# Patient Record
Sex: Female | Born: 1950
Health system: Southern US, Community
[De-identification: ages and names within clinical notes are randomized; demographics above are authoritative.]

## PROBLEM LIST (undated history)

## (undated) DIAGNOSIS — D229 Melanocytic nevi, unspecified: Secondary | ICD-10-CM

---
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
## 1999-10-17 ENCOUNTER — Encounter: Admission: RE | Admit: 1999-10-17 | Discharge: 1999-10-17 | Payer: Self-pay | Admitting: Obstetrics and Gynecology

## 1999-10-17 ENCOUNTER — Encounter: Payer: Self-pay | Admitting: Obstetrics and Gynecology

## 2000-11-11 ENCOUNTER — Other Ambulatory Visit: Admission: RE | Admit: 2000-11-11 | Discharge: 2000-11-11 | Payer: Self-pay | Admitting: Obstetrics and Gynecology

## 2001-03-20 ENCOUNTER — Encounter: Payer: Self-pay | Admitting: Obstetrics and Gynecology

## 2001-03-20 ENCOUNTER — Encounter: Admission: RE | Admit: 2001-03-20 | Discharge: 2001-03-20 | Payer: Self-pay | Admitting: Obstetrics and Gynecology

## 2001-11-03 ENCOUNTER — Other Ambulatory Visit: Admission: RE | Admit: 2001-11-03 | Discharge: 2001-11-03 | Payer: Self-pay | Admitting: Gynecology

## 2002-03-30 ENCOUNTER — Encounter: Payer: Self-pay | Admitting: Gynecology

## 2002-03-30 ENCOUNTER — Encounter: Admission: RE | Admit: 2002-03-30 | Discharge: 2002-03-30 | Payer: Self-pay | Admitting: Gynecology

## 2002-11-08 ENCOUNTER — Other Ambulatory Visit: Admission: RE | Admit: 2002-11-08 | Discharge: 2002-11-08 | Payer: Self-pay | Admitting: Gynecology

## 2003-04-07 ENCOUNTER — Encounter: Admission: RE | Admit: 2003-04-07 | Discharge: 2003-04-07 | Payer: Self-pay | Admitting: Gynecology

## 2003-04-07 ENCOUNTER — Encounter: Payer: Self-pay | Admitting: Gynecology

## 2004-03-19 ENCOUNTER — Other Ambulatory Visit: Admission: RE | Admit: 2004-03-19 | Discharge: 2004-03-19 | Payer: Self-pay | Admitting: Gynecology

## 2004-05-07 ENCOUNTER — Other Ambulatory Visit: Admission: RE | Admit: 2004-05-07 | Discharge: 2004-05-07 | Payer: Self-pay | Admitting: Gynecology

## 2004-05-24 ENCOUNTER — Encounter: Admission: RE | Admit: 2004-05-24 | Discharge: 2004-05-24 | Payer: Self-pay | Admitting: Gynecology

## 2005-04-19 DIAGNOSIS — D229 Melanocytic nevi, unspecified: Secondary | ICD-10-CM

## 2005-04-19 HISTORY — DX: Melanocytic nevi, unspecified: D22.9

## 2005-05-28 ENCOUNTER — Encounter: Admission: RE | Admit: 2005-05-28 | Discharge: 2005-05-28 | Payer: Self-pay | Admitting: Gynecology

## 2005-05-28 ENCOUNTER — Other Ambulatory Visit: Admission: RE | Admit: 2005-05-28 | Discharge: 2005-05-28 | Payer: Self-pay | Admitting: Gynecology

## 2005-08-27 DIAGNOSIS — D229 Melanocytic nevi, unspecified: Secondary | ICD-10-CM

## 2005-08-27 HISTORY — DX: Melanocytic nevi, unspecified: D22.9

## 2006-06-23 ENCOUNTER — Other Ambulatory Visit: Admission: RE | Admit: 2006-06-23 | Discharge: 2006-06-23 | Payer: Self-pay | Admitting: Gynecology

## 2006-07-16 ENCOUNTER — Encounter: Admission: RE | Admit: 2006-07-16 | Discharge: 2006-07-16 | Payer: Self-pay | Admitting: Gynecology

## 2007-07-21 ENCOUNTER — Ambulatory Visit (HOSPITAL_BASED_OUTPATIENT_CLINIC_OR_DEPARTMENT_OTHER): Admission: RE | Admit: 2007-07-21 | Discharge: 2007-07-21 | Payer: Self-pay | Admitting: Orthopedic Surgery

## 2007-11-13 ENCOUNTER — Encounter: Admission: RE | Admit: 2007-11-13 | Discharge: 2007-11-13 | Payer: Self-pay | Admitting: Gynecology

## 2008-12-01 ENCOUNTER — Encounter: Admission: RE | Admit: 2008-12-01 | Discharge: 2008-12-01 | Payer: Self-pay | Admitting: Gynecology

## 2008-12-07 ENCOUNTER — Encounter: Admission: RE | Admit: 2008-12-07 | Discharge: 2008-12-07 | Payer: Self-pay | Admitting: Gynecology

## 2009-12-26 ENCOUNTER — Encounter: Admission: RE | Admit: 2009-12-26 | Discharge: 2009-12-26 | Payer: Self-pay | Admitting: Gynecology

## 2010-07-29 ENCOUNTER — Encounter: Payer: Self-pay | Admitting: Gynecology

## 2010-07-30 ENCOUNTER — Encounter: Payer: Self-pay | Admitting: Gynecology

## 2010-11-20 NOTE — Op Note (Signed)
Ruth Rios, Ruth Rios             ACCOUNT NO.:  [id number]   MEDICAL RECORD NO.:  [id number]          PATIENT TYPE:  AMB   LOCATION:  DSC                          FACILITY:  [facility]   PHYSICIAN:  Katy Fitch. Sypher, M.D. 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 Resection of mucoid cyst from the dorsal nail fold.  2. Arthrotomy of interphalangeal joint left thumb with synovectomy and      removal of loose bodies and marginal osteophytes.   OPERATIONS:  Ruth Igo, MD.   ASSISTANT:  Annye Rusk, PA-C.   ANESTHESIA:  2% lidocaine metacarpal head level block left thumb  supplemented by IV sedation, supervising anesthesiologist is Dr.  Sampson Goon.   INDICATIONS:  Ruth Rios is a 60 year old woman whose referred  through the courtesy of Dr. Benjaman Kindler for evaluation and management of  a large mucoid cyst on the dorsal nail fold of the left thumb.  She had  background osteoarthritis that she was tolerating rather well.  The cyst  became enlarged to the point of near rupture measuring 1.5 cm in  diameter.   In the office, we provided a detailed informed consent and reviewed x-  rays which revealed degenerative arthritis of the interphalangeal joint.  We advised Ruth Rios to undergo debridement of the mucoid cyst and  synovectomy/debridement of the interphalangeal joint to remove loose  bodies and marginal osteophytes.   She understands that we cannot control the arthritis process with this  procedure.  Our goal is to relieve her of the mucoid cyst to prevent  rupture and possible septic arthritis.   Questions were invited and answered in detail in the office as well as  the preop holding area.   PROCEDURE:  Yassmine Tamm is brought to the operating room and placed  in supine position on the operating table.   Following light sedation, the left arm was prepped with Betadine soap  solution and sterilely draped.  2% lidocaine was infiltrated at the  metacarpal head level to obtain a digital block of the left thumb.  Following exsanguination of the thumb with a gauze wrap, a 1/2-inch  Penrose drain was placed at the base the proximal phalanx as a digital  tourniquet.   A dorsal lazy S incision was fashioned to expose the extensor mechanism  and the cyst.  The cyst was circumferentially dissected and removed  completely with its contents from the dorsal periosteum of the base of  the distal phalanx.  There  appeared to be a sinus tract exiting along  the dorsal ulnar aspect of the joint.   Dorsal radial and dorsal ulnar arthrotomies were performed by removing  the capsule between the radial collateral ligament and the extensor  tendon and the ulnar collateral ligament and the extensor tendon.  A  micro rongeur and micro curette were used to perform complete  synovectomy of the dorsal aspect of the IP joint as well as to remove  marginal osteophytes at the dorsal radial base of the distal phalanx and  to debride the osteophytes at the insertion of the extensor tendon at  the base of distal phalanx.  A micro curette was used to work beneath  the collateral ligaments.   After complete synovectomy, the wound was irrigated thoroughly with  sterile saline followed by intra-articular irrigation with a  19-gauge  blunt dental needle.   Bleeding points were electrocauterized with bipolar current followed by  release of the tourniquet.  Hemostasis was achieved by direct  compression followed by repair of the wound with corner sutures of 5-0  nylon and horizontal mattress sutures of 5-0 nylon.   The wound was dressed with Xeroflo, sterile gauze and Coban.  There were  no apparent complications.   For aftercare Ms. Koopmann is provided a prescription for Vicodin 5 mg 1  p.o. q.4-6 hours p.r.n. pain 20 tablets without refill.  Also Keflex 500  mg one p.o. q.8 h x4 days as a prophylactic antibiotic.      Katy Fitch Sypher, M.D.  Electronically Signed     RVS/MEDQ  D:  07/21/2007  T:  07/21/2007  Job:  [id number]   cc:   Theressa Millard, M.D.

## 2010-12-06 ENCOUNTER — Ambulatory Visit: Payer: Self-pay | Admitting: Psychology

## 2010-12-14 ENCOUNTER — Ambulatory Visit (INDEPENDENT_AMBULATORY_CARE_PROVIDER_SITE_OTHER): Payer: BC Managed Care – PPO | Admitting: Psychology

## 2010-12-14 DIAGNOSIS — F4323 Adjustment disorder with mixed anxiety and depressed mood: Secondary | ICD-10-CM

## 2010-12-28 ENCOUNTER — Ambulatory Visit (INDEPENDENT_AMBULATORY_CARE_PROVIDER_SITE_OTHER): Payer: BC Managed Care – PPO | Admitting: Psychology

## 2010-12-28 DIAGNOSIS — F4323 Adjustment disorder with mixed anxiety and depressed mood: Secondary | ICD-10-CM

## 2011-01-14 ENCOUNTER — Other Ambulatory Visit: Payer: Self-pay | Admitting: Gynecology

## 2011-01-14 ENCOUNTER — Other Ambulatory Visit: Payer: Self-pay | Admitting: Internal Medicine

## 2011-01-14 DIAGNOSIS — Z1231 Encounter for screening mammogram for malignant neoplasm of breast: Secondary | ICD-10-CM

## 2011-02-07 ENCOUNTER — Ambulatory Visit
Admission: RE | Admit: 2011-02-07 | Discharge: 2011-02-07 | Disposition: A | Discharge status: Home / Self Care | Payer: BC Managed Care – PPO | Source: Ambulatory Visit | Attending: Gynecology | Admitting: Gynecology

## 2011-02-07 DIAGNOSIS — Z1231 Encounter for screening mammogram for malignant neoplasm of breast: Secondary | ICD-10-CM

## 2011-03-28 LAB — BASIC METABOLIC PANEL
BUN: 3 — ABNORMAL LOW
CO2: 30
Calcium: 9.3
Chloride: 90 — ABNORMAL LOW
Creatinine, Ser: 0.65
GFR calc Af Amer: >60
GFR calc non Af Amer: >60
Glucose, Bld: 98
Potassium: 3.6
Sodium: 131 — ABNORMAL LOW

## 2011-08-26 ENCOUNTER — Ambulatory Visit (HOSPITAL_COMMUNITY)
Admission: RE | Admit: 2011-08-26 | Discharge: 2011-08-26 | Disposition: A | Discharge status: Home / Self Care | Payer: BC Managed Care – PPO | Source: Ambulatory Visit | Attending: Orthopaedic Surgery | Admitting: Orthopaedic Surgery

## 2011-08-26 DIAGNOSIS — M79609 Pain in unspecified limb: Secondary | ICD-10-CM | POA: Insufficient documentation

## 2011-08-26 DIAGNOSIS — R52 Pain, unspecified: Secondary | ICD-10-CM

## 2011-08-26 NOTE — Progress Notes (Signed)
VASCULAR LAB PRELIMINARY  PRELIMINARY  PRELIMINARY  PRELIMINARY  Right lower extremity venous duplex completed.    Preliminary report:  Superficial thrombosis noted in the right lesser saphenous vein.  No propagation into the popliteal vein.  All other veins patent.  Fluid noted in the popliteal fossa and in the upper calf where the patient describes the most pain.     Terance Hart, RVT 08/26/2011, 5:00 PM

## 2011-09-04 ENCOUNTER — Ambulatory Visit (HOSPITAL_COMMUNITY): Admission: RE | Admit: 2011-09-04 | Payer: BC Managed Care – PPO | Source: Ambulatory Visit

## 2011-09-05 ENCOUNTER — Ambulatory Visit (HOSPITAL_COMMUNITY)
Admission: RE | Admit: 2011-09-05 | Discharge: 2011-09-05 | Disposition: A | Discharge status: Home / Self Care | Payer: BC Managed Care – PPO | Source: Ambulatory Visit | Attending: Orthopaedic Surgery | Admitting: Orthopaedic Surgery

## 2011-09-05 DIAGNOSIS — Z86718 Personal history of other venous thrombosis and embolism: Secondary | ICD-10-CM | POA: Insufficient documentation

## 2011-09-05 DIAGNOSIS — I82409 Acute embolism and thrombosis of unspecified deep veins of unspecified lower extremity: Secondary | ICD-10-CM

## 2011-09-05 NOTE — Progress Notes (Signed)
VASCULAR LAB PRELIMINARY  PRELIMINARY  PRELIMINARY  PRELIMINARY  Limited right lower extremity venous duplex has been completed.    Preliminary report: The right short saphenous vein thrombosis noted on 08/26/2011 has not propagated and appears small than previously noted.  Vanna Scotland, 09/05/2011, 6:52 PM

## 2011-09-26 ENCOUNTER — Ambulatory Visit (HOSPITAL_COMMUNITY): Payer: BC Managed Care – PPO

## 2011-09-27 ENCOUNTER — Ambulatory Visit (HOSPITAL_COMMUNITY)
Admission: RE | Admit: 2011-09-27 | Discharge: 2011-09-27 | Disposition: A | Discharge status: Home / Self Care | Payer: BC Managed Care – PPO | Source: Ambulatory Visit | Attending: Family Medicine | Admitting: Family Medicine

## 2011-09-27 DIAGNOSIS — M79609 Pain in unspecified limb: Secondary | ICD-10-CM

## 2011-09-27 DIAGNOSIS — M7989 Other specified soft tissue disorders: Secondary | ICD-10-CM | POA: Insufficient documentation

## 2011-09-27 DIAGNOSIS — R52 Pain, unspecified: Secondary | ICD-10-CM

## 2011-09-27 NOTE — Progress Notes (Signed)
VASCULAR LAB PRELIMINARY  PRELIMINARY  PRELIMINARY  PRELIMINARY  Right lower extremity venous duplex completed.    Preliminary report:   Right lower extremity negative for DVT and superficial thrombosis. Unable to identify right lesser saphenous vein possibly due to atrophy. Prior lesser saphenous thrombosis appears to have completely resolved. Right baker's cyst seen.  Elpidio Galea RDMS, RDCS Vanna Scotland, 09/27/2011, 11:22 AM

## 2013-07-21 ENCOUNTER — Other Ambulatory Visit: Payer: Self-pay

## 2013-07-21 DIAGNOSIS — Z1231 Encounter for screening mammogram for malignant neoplasm of breast: Secondary | ICD-10-CM

## 2013-08-12 ENCOUNTER — Ambulatory Visit
Admission: RE | Admit: 2013-08-12 | Discharge: 2013-08-12 | Disposition: A | Discharge status: Home / Self Care | Payer: BC Managed Care – PPO | Source: Ambulatory Visit

## 2013-08-12 DIAGNOSIS — Z1231 Encounter for screening mammogram for malignant neoplasm of breast: Secondary | ICD-10-CM

## 2014-09-20 ENCOUNTER — Other Ambulatory Visit: Payer: Self-pay

## 2014-09-20 DIAGNOSIS — Z1231 Encounter for screening mammogram for malignant neoplasm of breast: Secondary | ICD-10-CM

## 2014-10-13 ENCOUNTER — Ambulatory Visit
Admission: RE | Admit: 2014-10-13 | Discharge: 2014-10-13 | Disposition: A | Discharge status: Home / Self Care | Payer: BLUE CROSS/BLUE SHIELD | Source: Ambulatory Visit

## 2014-10-13 DIAGNOSIS — Z1231 Encounter for screening mammogram for malignant neoplasm of breast: Secondary | ICD-10-CM

## 2015-12-11 ENCOUNTER — Other Ambulatory Visit: Payer: Self-pay | Admitting: Obstetrics and Gynecology

## 2015-12-11 ENCOUNTER — Other Ambulatory Visit: Payer: Self-pay

## 2015-12-11 DIAGNOSIS — Z1231 Encounter for screening mammogram for malignant neoplasm of breast: Secondary | ICD-10-CM

## 2015-12-25 ENCOUNTER — Ambulatory Visit: Payer: BLUE CROSS/BLUE SHIELD

## 2016-03-13 ENCOUNTER — Ambulatory Visit: Payer: BLUE CROSS/BLUE SHIELD

## 2016-03-15 ENCOUNTER — Ambulatory Visit: Payer: BLUE CROSS/BLUE SHIELD

## 2016-03-22 ENCOUNTER — Ambulatory Visit
Admission: RE | Admit: 2016-03-22 | Discharge: 2016-03-22 | Disposition: A | Discharge status: Home / Self Care | Payer: BLUE CROSS/BLUE SHIELD | Source: Ambulatory Visit | Attending: Obstetrics and Gynecology | Admitting: Obstetrics and Gynecology

## 2016-03-22 DIAGNOSIS — Z1231 Encounter for screening mammogram for malignant neoplasm of breast: Secondary | ICD-10-CM

## 2017-03-07 ENCOUNTER — Other Ambulatory Visit: Payer: Self-pay | Admitting: Obstetrics and Gynecology

## 2017-03-07 DIAGNOSIS — Z1231 Encounter for screening mammogram for malignant neoplasm of breast: Secondary | ICD-10-CM

## 2017-03-24 ENCOUNTER — Ambulatory Visit: Payer: BLUE CROSS/BLUE SHIELD

## 2017-03-27 ENCOUNTER — Ambulatory Visit
Admission: RE | Admit: 2017-03-27 | Discharge: 2017-03-27 | Disposition: A | Discharge status: Home / Self Care | Payer: BLUE CROSS/BLUE SHIELD | Source: Ambulatory Visit | Attending: Obstetrics and Gynecology | Admitting: Obstetrics and Gynecology

## 2017-03-27 DIAGNOSIS — Z1231 Encounter for screening mammogram for malignant neoplasm of breast: Secondary | ICD-10-CM

## 2017-08-07 DIAGNOSIS — R69 Illness, unspecified: Secondary | ICD-10-CM | POA: Diagnosis not present

## 2017-08-12 DIAGNOSIS — Z1211 Encounter for screening for malignant neoplasm of colon: Secondary | ICD-10-CM | POA: Diagnosis not present

## 2017-08-12 DIAGNOSIS — Z0000 Encounter for general adult medical examination without abnormal findings: Secondary | ICD-10-CM | POA: Diagnosis not present

## 2017-08-12 DIAGNOSIS — N952 Postmenopausal atrophic vaginitis: Secondary | ICD-10-CM | POA: Diagnosis not present

## 2017-08-12 DIAGNOSIS — Z136 Encounter for screening for cardiovascular disorders: Secondary | ICD-10-CM | POA: Diagnosis not present

## 2017-08-12 DIAGNOSIS — R609 Edema, unspecified: Secondary | ICD-10-CM | POA: Diagnosis not present

## 2017-08-12 DIAGNOSIS — M79644 Pain in right finger(s): Secondary | ICD-10-CM | POA: Diagnosis not present

## 2017-08-12 DIAGNOSIS — Z86718 Personal history of other venous thrombosis and embolism: Secondary | ICD-10-CM | POA: Diagnosis not present

## 2017-08-22 DIAGNOSIS — R229 Localized swelling, mass and lump, unspecified: Secondary | ICD-10-CM | POA: Diagnosis not present

## 2017-08-22 DIAGNOSIS — M19031 Primary osteoarthritis, right wrist: Secondary | ICD-10-CM | POA: Diagnosis not present

## 2017-09-10 DIAGNOSIS — R2231 Localized swelling, mass and lump, right upper limb: Secondary | ICD-10-CM | POA: Diagnosis not present

## 2017-09-15 DIAGNOSIS — D229 Melanocytic nevi, unspecified: Secondary | ICD-10-CM | POA: Diagnosis not present

## 2017-09-15 DIAGNOSIS — L669 Cicatricial alopecia, unspecified: Secondary | ICD-10-CM | POA: Diagnosis not present

## 2017-09-16 DIAGNOSIS — R2231 Localized swelling, mass and lump, right upper limb: Secondary | ICD-10-CM | POA: Diagnosis not present

## 2017-11-21 DIAGNOSIS — Z1211 Encounter for screening for malignant neoplasm of colon: Secondary | ICD-10-CM | POA: Diagnosis not present

## 2017-12-22 DIAGNOSIS — R69 Illness, unspecified: Secondary | ICD-10-CM | POA: Diagnosis not present

## 2018-02-23 DIAGNOSIS — M25561 Pain in right knee: Secondary | ICD-10-CM | POA: Diagnosis not present

## 2018-02-23 DIAGNOSIS — M25531 Pain in right wrist: Secondary | ICD-10-CM | POA: Diagnosis not present

## 2018-03-03 ENCOUNTER — Other Ambulatory Visit: Payer: Self-pay | Admitting: Obstetrics and Gynecology

## 2018-03-03 DIAGNOSIS — Z1231 Encounter for screening mammogram for malignant neoplasm of breast: Secondary | ICD-10-CM

## 2018-04-17 DIAGNOSIS — R69 Illness, unspecified: Secondary | ICD-10-CM | POA: Diagnosis not present

## 2018-04-20 ENCOUNTER — Ambulatory Visit
Admission: RE | Admit: 2018-04-20 | Discharge: 2018-04-20 | Disposition: A | Discharge status: Home / Self Care | Payer: Medicare HMO | Source: Ambulatory Visit | Attending: Obstetrics and Gynecology | Admitting: Obstetrics and Gynecology

## 2018-04-20 DIAGNOSIS — Z1231 Encounter for screening mammogram for malignant neoplasm of breast: Secondary | ICD-10-CM | POA: Diagnosis not present

## 2018-05-11 DIAGNOSIS — H5203 Hypermetropia, bilateral: Secondary | ICD-10-CM | POA: Diagnosis not present

## 2018-05-11 DIAGNOSIS — H52223 Regular astigmatism, bilateral: Secondary | ICD-10-CM | POA: Diagnosis not present

## 2018-05-11 DIAGNOSIS — H524 Presbyopia: Secondary | ICD-10-CM | POA: Diagnosis not present

## 2018-06-03 DIAGNOSIS — H43812 Vitreous degeneration, left eye: Secondary | ICD-10-CM | POA: Diagnosis not present

## 2018-06-19 DIAGNOSIS — M25531 Pain in right wrist: Secondary | ICD-10-CM | POA: Diagnosis not present

## 2018-06-26 DIAGNOSIS — R200 Anesthesia of skin: Secondary | ICD-10-CM | POA: Diagnosis not present

## 2018-07-20 DIAGNOSIS — N951 Menopausal and female climacteric states: Secondary | ICD-10-CM | POA: Diagnosis not present

## 2018-07-20 DIAGNOSIS — Z6820 Body mass index (BMI) 20.0-20.9, adult: Secondary | ICD-10-CM | POA: Diagnosis not present

## 2018-07-20 DIAGNOSIS — Z124 Encounter for screening for malignant neoplasm of cervix: Secondary | ICD-10-CM | POA: Diagnosis not present

## 2018-07-20 DIAGNOSIS — H43811 Vitreous degeneration, right eye: Secondary | ICD-10-CM | POA: Diagnosis not present

## 2018-07-20 DIAGNOSIS — H43812 Vitreous degeneration, left eye: Secondary | ICD-10-CM | POA: Diagnosis not present

## 2018-07-29 DIAGNOSIS — G5601 Carpal tunnel syndrome, right upper limb: Secondary | ICD-10-CM | POA: Diagnosis not present

## 2018-08-18 DIAGNOSIS — N952 Postmenopausal atrophic vaginitis: Secondary | ICD-10-CM | POA: Diagnosis not present

## 2018-08-18 DIAGNOSIS — Z1389 Encounter for screening for other disorder: Secondary | ICD-10-CM | POA: Diagnosis not present

## 2018-08-18 DIAGNOSIS — Z23 Encounter for immunization: Secondary | ICD-10-CM | POA: Diagnosis not present

## 2018-08-18 DIAGNOSIS — H43393 Other vitreous opacities, bilateral: Secondary | ICD-10-CM | POA: Diagnosis not present

## 2018-08-18 DIAGNOSIS — R609 Edema, unspecified: Secondary | ICD-10-CM | POA: Diagnosis not present

## 2018-08-18 DIAGNOSIS — G5601 Carpal tunnel syndrome, right upper limb: Secondary | ICD-10-CM | POA: Diagnosis not present

## 2018-08-18 DIAGNOSIS — Z1159 Encounter for screening for other viral diseases: Secondary | ICD-10-CM | POA: Diagnosis not present

## 2018-08-18 DIAGNOSIS — Z0000 Encounter for general adult medical examination without abnormal findings: Secondary | ICD-10-CM | POA: Diagnosis not present

## 2018-08-31 DIAGNOSIS — G5601 Carpal tunnel syndrome, right upper limb: Secondary | ICD-10-CM | POA: Diagnosis not present

## 2018-12-01 DIAGNOSIS — M67431 Ganglion, right wrist: Secondary | ICD-10-CM | POA: Diagnosis not present

## 2018-12-01 DIAGNOSIS — G5601 Carpal tunnel syndrome, right upper limb: Secondary | ICD-10-CM | POA: Diagnosis not present

## 2018-12-23 DIAGNOSIS — R69 Illness, unspecified: Secondary | ICD-10-CM | POA: Diagnosis not present

## 2018-12-29 ENCOUNTER — Other Ambulatory Visit: Payer: Self-pay | Admitting: Orthopedic Surgery

## 2018-12-29 DIAGNOSIS — M25531 Pain in right wrist: Secondary | ICD-10-CM

## 2019-01-19 ENCOUNTER — Ambulatory Visit
Admission: RE | Admit: 2019-01-19 | Discharge: 2019-01-19 | Disposition: A | Discharge status: Home / Self Care | Payer: Medicare HMO | Source: Ambulatory Visit | Attending: Orthopedic Surgery | Admitting: Orthopedic Surgery

## 2019-01-19 DIAGNOSIS — S56211A Strain of other flexor muscle, fascia and tendon at forearm level, right arm, initial encounter: Secondary | ICD-10-CM | POA: Diagnosis not present

## 2019-01-19 DIAGNOSIS — M25531 Pain in right wrist: Secondary | ICD-10-CM

## 2019-01-19 MED ORDER — GADOBENATE DIMEGLUMINE 529 MG/ML IV SOLN
10.0000 mL | Freq: Once | INTRAVENOUS | Status: AC | PRN
  Administered 2019-01-19: 10 mL via INTRAVENOUS

## 2019-01-25 ENCOUNTER — Encounter: Payer: Self-pay | Admitting: *Deleted

## 2019-02-04 DIAGNOSIS — M659 Synovitis and tenosynovitis, unspecified: Secondary | ICD-10-CM | POA: Diagnosis not present

## 2019-02-04 DIAGNOSIS — G5601 Carpal tunnel syndrome, right upper limb: Secondary | ICD-10-CM | POA: Diagnosis not present

## 2019-04-20 DIAGNOSIS — R69 Illness, unspecified: Secondary | ICD-10-CM | POA: Diagnosis not present

## 2019-05-13 DIAGNOSIS — M5431 Sciatica, right side: Secondary | ICD-10-CM | POA: Diagnosis not present

## 2019-05-13 DIAGNOSIS — S46211A Strain of muscle, fascia and tendon of other parts of biceps, right arm, initial encounter: Secondary | ICD-10-CM | POA: Diagnosis not present

## 2019-05-13 DIAGNOSIS — G5601 Carpal tunnel syndrome, right upper limb: Secondary | ICD-10-CM | POA: Diagnosis not present

## 2019-05-13 DIAGNOSIS — M659 Synovitis and tenosynovitis, unspecified: Secondary | ICD-10-CM | POA: Diagnosis not present

## 2019-06-22 DIAGNOSIS — M533 Sacrococcygeal disorders, not elsewhere classified: Secondary | ICD-10-CM | POA: Diagnosis not present

## 2019-06-22 DIAGNOSIS — M545 Low back pain: Secondary | ICD-10-CM | POA: Diagnosis not present

## 2019-06-28 ENCOUNTER — Other Ambulatory Visit: Payer: Self-pay | Admitting: Internal Medicine

## 2019-06-28 DIAGNOSIS — Z1231 Encounter for screening mammogram for malignant neoplasm of breast: Secondary | ICD-10-CM

## 2019-07-19 DIAGNOSIS — H25013 Cortical age-related cataract, bilateral: Secondary | ICD-10-CM | POA: Diagnosis not present

## 2019-07-19 DIAGNOSIS — H2513 Age-related nuclear cataract, bilateral: Secondary | ICD-10-CM | POA: Diagnosis not present

## 2019-07-19 DIAGNOSIS — H43813 Vitreous degeneration, bilateral: Secondary | ICD-10-CM | POA: Diagnosis not present

## 2019-07-23 ENCOUNTER — Ambulatory Visit: Payer: Medicare Other | Attending: Internal Medicine

## 2019-07-23 DIAGNOSIS — Z23 Encounter for immunization: Secondary | ICD-10-CM | POA: Insufficient documentation

## 2019-07-23 NOTE — Progress Notes (Signed)
   Covid-19 Vaccination Clinic  Name:  Ruth Rios    MRN: [id number] DOB: [birth year]  07/23/2019  [name] was observed post Covid-19 immunization for 15 minutes without incidence. She was provided with Vaccine Information Sheet and instruction to access the V-Safe system.   [name] was instructed to call 911 with any severe reactions post vaccine: Marland Kitchen Difficulty breathing  . Swelling of your face and throat  . A fast heartbeat  . A bad rash all over your body  . Dizziness and weakness    Immunizations Administered    Name Date Dose VIS Date Route   Pfizer COVID-19 Vaccine 07/23/2019 12:34 PM 0.3 mL 06/18/2019 Intramuscular   Manufacturer: [place]   Lot: [id number]   [place]: [id number]

## 2019-08-12 ENCOUNTER — Ambulatory Visit: Payer: Medicare HMO | Attending: Internal Medicine

## 2019-08-12 DIAGNOSIS — Z23 Encounter for immunization: Secondary | ICD-10-CM | POA: Insufficient documentation

## 2019-08-12 NOTE — Progress Notes (Signed)
   Covid-19 Vaccination Clinic  Name:  Ruth Rios    MRN: [id number] DOB: 04-19-1951  08/12/2019  Ruth Rios was observed post Covid-19 immunization for 15 minutes without incidence. She was provided with Vaccine Information Sheet and instruction to access the V-Safe system.   Ruth Rios was instructed to call 911 with any severe reactions post vaccine: Marland Kitchen Difficulty breathing  . Swelling of your face and throat  . A fast heartbeat  . A bad rash all over your body  . Dizziness and weakness    Immunizations Administered    Name Date Dose VIS Date Route   Pfizer COVID-19 Vaccine 08/12/2019 12:36 PM 0.3 mL 06/18/2019 Intramuscular   Manufacturer: [place]   Lot: [id number]   [place]: [id number]

## 2019-08-16 ENCOUNTER — Other Ambulatory Visit: Payer: Self-pay

## 2019-08-16 ENCOUNTER — Ambulatory Visit
Admission: RE | Admit: 2019-08-16 | Discharge: 2019-08-16 | Disposition: A | Discharge status: Home / Self Care | Payer: Medicare HMO | Source: Ambulatory Visit | Attending: Internal Medicine | Admitting: Internal Medicine

## 2019-08-16 DIAGNOSIS — Z1231 Encounter for screening mammogram for malignant neoplasm of breast: Secondary | ICD-10-CM

## 2019-08-24 DIAGNOSIS — R609 Edema, unspecified: Secondary | ICD-10-CM | POA: Diagnosis not present

## 2019-08-24 DIAGNOSIS — Z79899 Other long term (current) drug therapy: Secondary | ICD-10-CM | POA: Diagnosis not present

## 2019-08-24 DIAGNOSIS — N952 Postmenopausal atrophic vaginitis: Secondary | ICD-10-CM | POA: Diagnosis not present

## 2019-08-24 DIAGNOSIS — M47816 Spondylosis without myelopathy or radiculopathy, lumbar region: Secondary | ICD-10-CM | POA: Diagnosis not present

## 2019-08-24 DIAGNOSIS — Z0000 Encounter for general adult medical examination without abnormal findings: Secondary | ICD-10-CM | POA: Diagnosis not present

## 2019-08-24 DIAGNOSIS — Z1389 Encounter for screening for other disorder: Secondary | ICD-10-CM | POA: Diagnosis not present

## 2019-08-24 DIAGNOSIS — G5601 Carpal tunnel syndrome, right upper limb: Secondary | ICD-10-CM | POA: Diagnosis not present

## 2019-08-24 DIAGNOSIS — H43393 Other vitreous opacities, bilateral: Secondary | ICD-10-CM | POA: Diagnosis not present

## 2019-08-26 DIAGNOSIS — M5416 Radiculopathy, lumbar region: Secondary | ICD-10-CM | POA: Diagnosis not present

## 2019-09-08 DIAGNOSIS — M25551 Pain in right hip: Secondary | ICD-10-CM | POA: Diagnosis not present

## 2019-09-22 DIAGNOSIS — M25551 Pain in right hip: Secondary | ICD-10-CM | POA: Diagnosis not present

## 2019-09-29 DIAGNOSIS — M4186 Other forms of scoliosis, lumbar region: Secondary | ICD-10-CM | POA: Diagnosis not present

## 2019-09-29 DIAGNOSIS — M5136 Other intervertebral disc degeneration, lumbar region: Secondary | ICD-10-CM | POA: Diagnosis not present

## 2019-10-19 DIAGNOSIS — R69 Illness, unspecified: Secondary | ICD-10-CM | POA: Diagnosis not present

## 2019-10-29 DIAGNOSIS — M25551 Pain in right hip: Secondary | ICD-10-CM | POA: Diagnosis not present

## 2019-11-10 DIAGNOSIS — M25551 Pain in right hip: Secondary | ICD-10-CM | POA: Diagnosis not present

## 2019-11-18 ENCOUNTER — Encounter: Payer: Self-pay | Admitting: *Deleted

## 2019-11-22 DIAGNOSIS — M25551 Pain in right hip: Secondary | ICD-10-CM | POA: Diagnosis not present

## 2019-11-23 ENCOUNTER — Other Ambulatory Visit: Payer: Self-pay

## 2019-11-23 ENCOUNTER — Encounter (INDEPENDENT_AMBULATORY_CARE_PROVIDER_SITE_OTHER): Payer: Self-pay

## 2019-11-23 ENCOUNTER — Ambulatory Visit: Payer: Medicare HMO | Admitting: Dermatology

## 2019-11-23 ENCOUNTER — Encounter: Payer: Self-pay | Admitting: Dermatology

## 2019-11-23 DIAGNOSIS — L821 Other seborrheic keratosis: Secondary | ICD-10-CM

## 2019-11-23 DIAGNOSIS — D485 Neoplasm of uncertain behavior of skin: Secondary | ICD-10-CM

## 2019-11-23 DIAGNOSIS — D225 Melanocytic nevi of trunk: Secondary | ICD-10-CM | POA: Diagnosis not present

## 2019-11-23 NOTE — Patient Instructions (Signed)

## 2019-11-28 ENCOUNTER — Encounter: Payer: Self-pay | Admitting: Dermatology

## 2019-11-28 NOTE — Progress Notes (Signed)
   Follow-Up Visit   Subjective  Ruth Rios is a 69 y.o. female who presents for the following: Skin Problem (Place on left upper shin that she is concerned about. Just feels rough. No bleeding. Does have a lingering irritation along her chest x2 weeks from when she was at the beach. Itches and is very red.).  New growth Location: Left shin Duration: 6 months Quality:  Associated Signs/Symptoms: Modifying Factors:  Severity:  Timing: Context: Street of atypical moles  The following portions of the chart were reviewed this encounter and updated as appropriate: Tobacco  Allergies  Meds  Problems  Med Hx  Surg Hx  Fam Hx      Objective  Well appearing patient in no apparent distress; mood and affect are within normal limits.  All sun exposed areas plus back examined.   Assessment & Plan  Neoplasm of uncertain behavior of skin Right Mid Back  Skin / nail biopsy Type of biopsy: tangential   Informed consent: discussed and consent obtained   Timeout: patient name, date of birth, surgical site, and procedure verified   Procedure prep:  Patient was prepped and draped in usual sterile fashion Prep type:  Chlorhexidine Anesthesia: the lesion was anesthetized in a standard fashion   Anesthetic:  1% lidocaine w/ epinephrine 1-100,000 local infiltration Instrument used: flexible razor blade   Hemostasis achieved with: ferric subsulfate   Outcome: patient tolerated procedure well   Post-procedure details: wound care instructions given    Specimen 1 - Surgical pathology Differential Diagnosis: atypia Check Margins: Yes  Seborrheic keratosis (4) Left Lower Leg - Anterior; Right Lower Leg - Anterior; Left Upper Back; Right Upper Back  Reassured

## 2019-12-08 DIAGNOSIS — M25551 Pain in right hip: Secondary | ICD-10-CM | POA: Diagnosis not present

## 2019-12-23 DIAGNOSIS — R69 Illness, unspecified: Secondary | ICD-10-CM | POA: Diagnosis not present

## 2020-02-05 DIAGNOSIS — R519 Headache, unspecified: Secondary | ICD-10-CM | POA: Diagnosis not present

## 2020-02-05 DIAGNOSIS — Z20828 Contact with and (suspected) exposure to other viral communicable diseases: Secondary | ICD-10-CM | POA: Diagnosis not present

## 2020-02-28 DIAGNOSIS — Z0000 Encounter for general adult medical examination without abnormal findings: Secondary | ICD-10-CM | POA: Diagnosis not present

## 2020-02-28 DIAGNOSIS — Z01419 Encounter for gynecological examination (general) (routine) without abnormal findings: Secondary | ICD-10-CM | POA: Diagnosis not present

## 2020-03-02 ENCOUNTER — Other Ambulatory Visit: Payer: Self-pay | Admitting: Obstetrics

## 2020-03-02 DIAGNOSIS — M8580 Other specified disorders of bone density and structure, unspecified site: Secondary | ICD-10-CM

## 2020-04-07 ENCOUNTER — Other Ambulatory Visit: Payer: Self-pay

## 2020-04-07 ENCOUNTER — Ambulatory Visit
Admission: RE | Admit: 2020-04-07 | Discharge: 2020-04-07 | Disposition: A | Discharge status: Home / Self Care | Payer: Medicare HMO | Source: Ambulatory Visit | Attending: Obstetrics | Admitting: Obstetrics

## 2020-04-07 DIAGNOSIS — M8580 Other specified disorders of bone density and structure, unspecified site: Secondary | ICD-10-CM

## 2020-04-07 DIAGNOSIS — M8589 Other specified disorders of bone density and structure, multiple sites: Secondary | ICD-10-CM | POA: Diagnosis not present

## 2020-04-07 DIAGNOSIS — Z780 Asymptomatic menopausal state: Secondary | ICD-10-CM | POA: Diagnosis not present

## 2020-05-09 DIAGNOSIS — R69 Illness, unspecified: Secondary | ICD-10-CM | POA: Diagnosis not present

## 2020-06-14 DIAGNOSIS — R69 Illness, unspecified: Secondary | ICD-10-CM | POA: Diagnosis not present

## 2020-06-29 ENCOUNTER — Other Ambulatory Visit: Payer: Medicare HMO

## 2020-07-18 DIAGNOSIS — Z0100 Encounter for examination of eyes and vision without abnormal findings: Secondary | ICD-10-CM | POA: Diagnosis not present

## 2020-07-18 DIAGNOSIS — H524 Presbyopia: Secondary | ICD-10-CM | POA: Diagnosis not present

## 2020-07-18 DIAGNOSIS — H43812 Vitreous degeneration, left eye: Secondary | ICD-10-CM | POA: Diagnosis not present

## 2020-07-18 DIAGNOSIS — H5203 Hypermetropia, bilateral: Secondary | ICD-10-CM | POA: Diagnosis not present

## 2020-07-18 DIAGNOSIS — H52223 Regular astigmatism, bilateral: Secondary | ICD-10-CM | POA: Diagnosis not present

## 2020-07-18 DIAGNOSIS — H43811 Vitreous degeneration, right eye: Secondary | ICD-10-CM | POA: Diagnosis not present

## 2020-07-18 DIAGNOSIS — H25041 Posterior subcapsular polar age-related cataract, right eye: Secondary | ICD-10-CM | POA: Diagnosis not present

## 2020-09-07 DIAGNOSIS — Z1211 Encounter for screening for malignant neoplasm of colon: Secondary | ICD-10-CM | POA: Diagnosis not present

## 2020-09-07 DIAGNOSIS — R600 Localized edema: Secondary | ICD-10-CM | POA: Diagnosis not present

## 2020-09-07 DIAGNOSIS — Z0000 Encounter for general adult medical examination without abnormal findings: Secondary | ICD-10-CM | POA: Diagnosis not present

## 2020-09-07 DIAGNOSIS — Z1389 Encounter for screening for other disorder: Secondary | ICD-10-CM | POA: Diagnosis not present

## 2020-09-07 DIAGNOSIS — H9313 Tinnitus, bilateral: Secondary | ICD-10-CM | POA: Diagnosis not present

## 2020-09-07 DIAGNOSIS — M47816 Spondylosis without myelopathy or radiculopathy, lumbar region: Secondary | ICD-10-CM | POA: Diagnosis not present

## 2020-09-07 DIAGNOSIS — H43393 Other vitreous opacities, bilateral: Secondary | ICD-10-CM | POA: Diagnosis not present

## 2020-09-07 DIAGNOSIS — G5601 Carpal tunnel syndrome, right upper limb: Secondary | ICD-10-CM | POA: Diagnosis not present

## 2020-09-07 DIAGNOSIS — Z1231 Encounter for screening mammogram for malignant neoplasm of breast: Secondary | ICD-10-CM | POA: Diagnosis not present

## 2020-09-14 ENCOUNTER — Other Ambulatory Visit: Payer: Self-pay | Admitting: Obstetrics

## 2020-09-14 DIAGNOSIS — Z1231 Encounter for screening mammogram for malignant neoplasm of breast: Secondary | ICD-10-CM

## 2020-10-27 DIAGNOSIS — E871 Hypo-osmolality and hyponatremia: Secondary | ICD-10-CM | POA: Diagnosis not present

## 2020-10-27 DIAGNOSIS — R609 Edema, unspecified: Secondary | ICD-10-CM | POA: Diagnosis not present

## 2020-11-08 ENCOUNTER — Ambulatory Visit: Payer: Medicare HMO

## 2020-11-09 ENCOUNTER — Ambulatory Visit: Payer: Medicare HMO | Attending: Critical Care Medicine

## 2020-11-09 DIAGNOSIS — Z20822 Contact with and (suspected) exposure to covid-19: Secondary | ICD-10-CM | POA: Diagnosis not present

## 2020-11-10 LAB — SARS-COV-2, NAA 2 DAY TAT

## 2020-11-10 LAB — NOVEL CORONAVIRUS, NAA: SARS-CoV-2, NAA: NOT DETECTED

## 2020-11-20 DIAGNOSIS — L2389 Allergic contact dermatitis due to other agents: Secondary | ICD-10-CM | POA: Diagnosis not present

## 2020-12-19 DIAGNOSIS — H9312 Tinnitus, left ear: Secondary | ICD-10-CM | POA: Diagnosis not present

## 2020-12-19 DIAGNOSIS — H90A21 Sensorineural hearing loss, unilateral, right ear, with restricted hearing on the contralateral side: Secondary | ICD-10-CM | POA: Diagnosis not present

## 2020-12-19 DIAGNOSIS — H90A32 Mixed conductive and sensorineural hearing loss, unilateral, left ear with restricted hearing on the contralateral side: Secondary | ICD-10-CM | POA: Diagnosis not present

## 2020-12-27 ENCOUNTER — Ambulatory Visit
Admission: RE | Admit: 2020-12-27 | Discharge: 2020-12-27 | Disposition: A | Discharge status: Home / Self Care | Payer: Medicare HMO | Source: Ambulatory Visit | Attending: Obstetrics | Admitting: Obstetrics

## 2020-12-27 ENCOUNTER — Other Ambulatory Visit: Payer: Self-pay

## 2020-12-27 DIAGNOSIS — Z1231 Encounter for screening mammogram for malignant neoplasm of breast: Secondary | ICD-10-CM

## 2021-01-24 DIAGNOSIS — J301 Allergic rhinitis due to pollen: Secondary | ICD-10-CM | POA: Diagnosis not present

## 2021-01-24 DIAGNOSIS — J321 Chronic frontal sinusitis: Secondary | ICD-10-CM | POA: Diagnosis not present

## 2021-02-07 IMAGING — MR MRI OF THE RIGHT WRIST WITHOUT AND WITH CONTRAST
6 of 9 series · 26 of 40 positions shown · IV contrast (multihance)
Comparison: None.

CLINICAL DATA: Chronic right volar radial wrist mass with numbness
and weakness.

EXAM:
MR OF THE RIGHT WRIST WITHOUT AND WITH CONTRAST
TECHNIQUE: Multiplanar multisequence MR imaging of the right wrist was
performed both before and after the administration of intravenous
contrast.
Creatinine was obtained on site at [HOSPITAL] at [HOSPITAL].
Results: Creatinine 0.6 mg/dL.
CONTRAST:  10mL MULTIHANCE GADOBENATE DIMEGLUMINE 529 MG/ML IV SOLN

[Series 7: T2 fat-sat · axial · 3.0mm · 0.23mm/px · z∈[-75,-8]mm · 5 of 22 slices shown (1 of 2)]
[im 1/22]
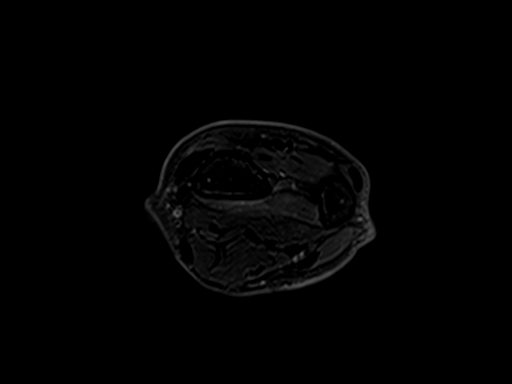
[im 6/22]
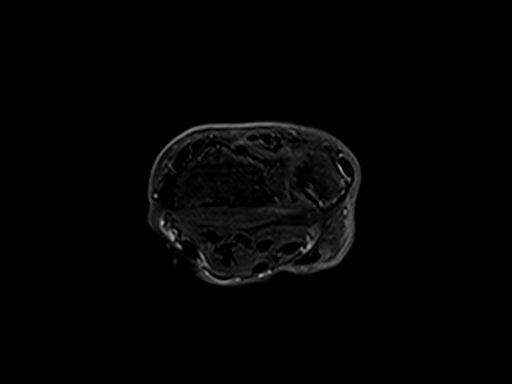
[im 11/22]
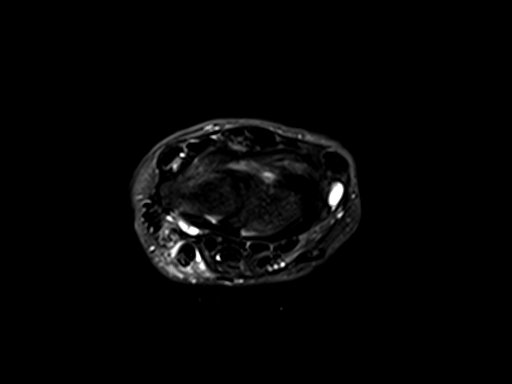
[im 16/22]
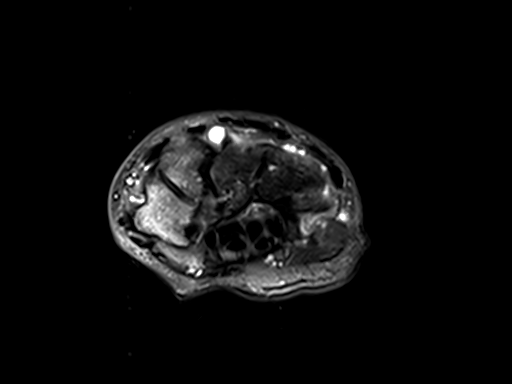
[im 22/22]
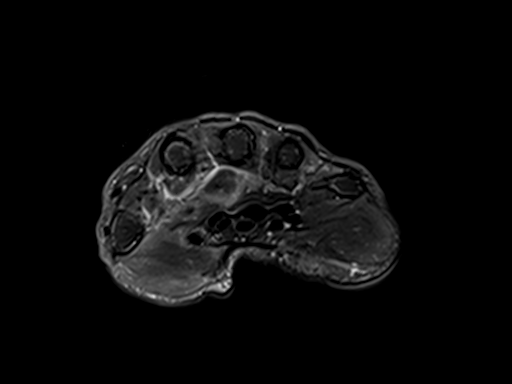

[Series 8: T1 · axial · 3.0mm · 0.23mm/px · z∈[-75,-9]mm · 5 of 22 slices shown (1 of 2)]
[im 1/22]
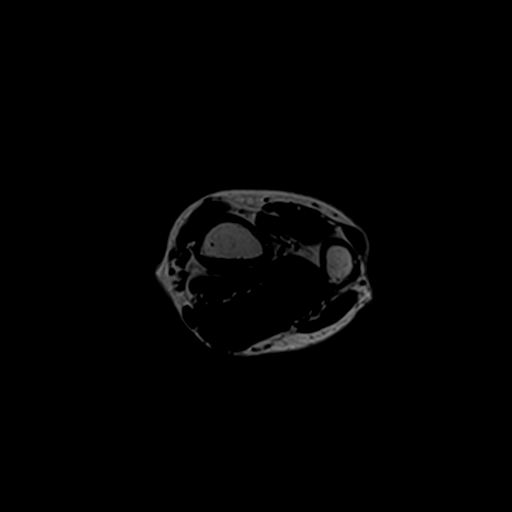
[im 6/22]
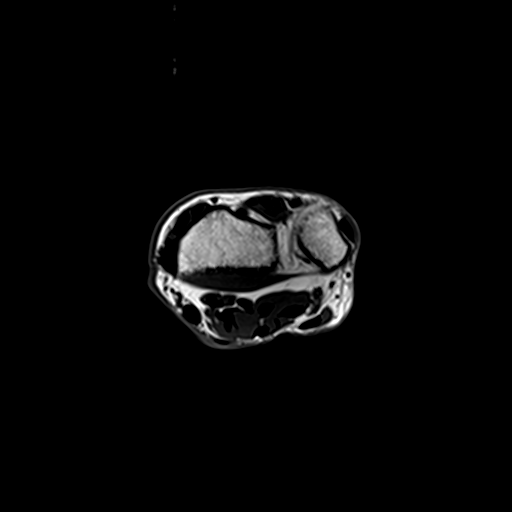
[im 11/22]
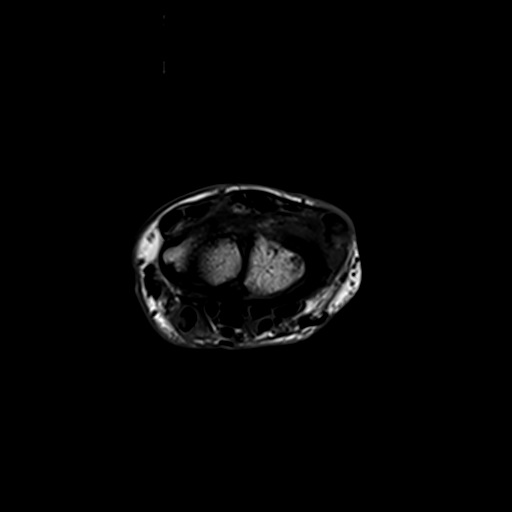
[im 16/22]
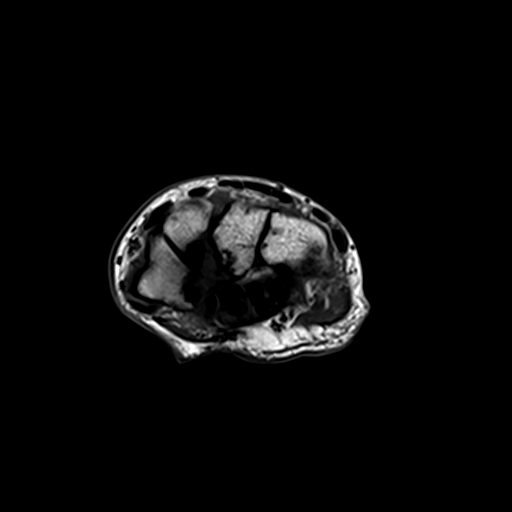
[im 22/22]
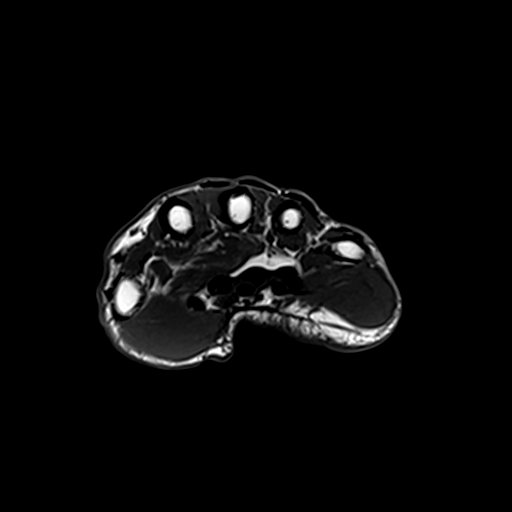

[Series 9: PD fat-sat · coronal · 3.0mm · 0.23mm/px · 4 of 15 slices shown (1 of 2)]
[im 1/15]
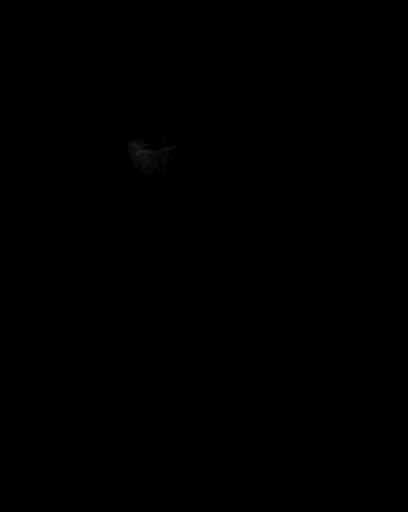
[im 5/15]
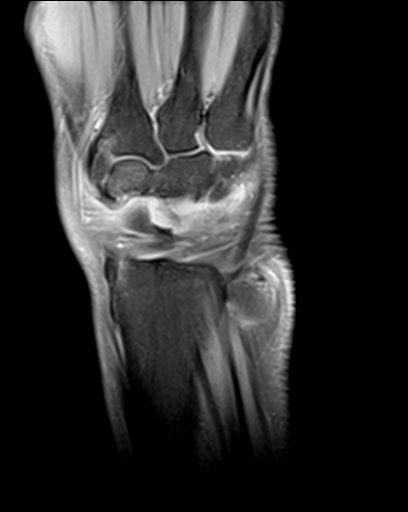
[im 10/15]
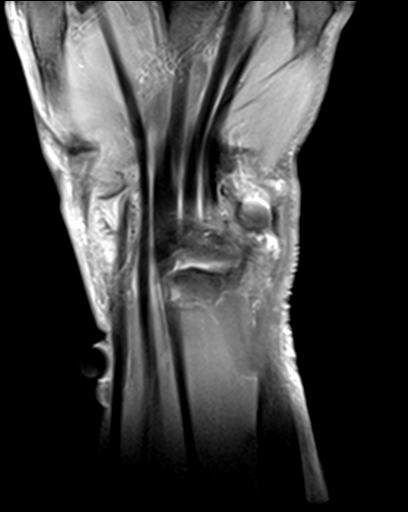
[im 15/15]
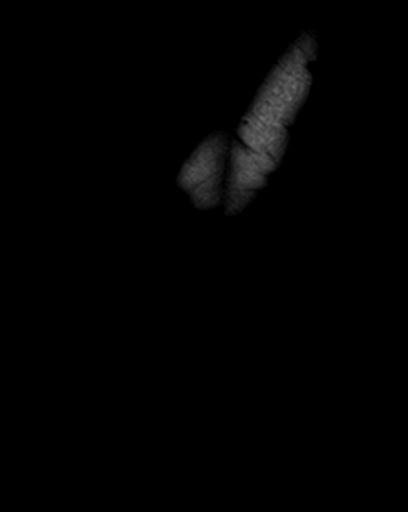

[Series 10: T1 · coronal · 3.0mm · 0.47mm/px · 4 of 15 slices shown (2 of 2)]
[im 1/15]
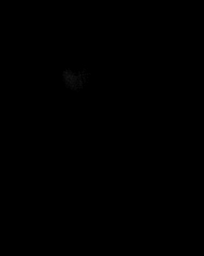
[im 5/15]
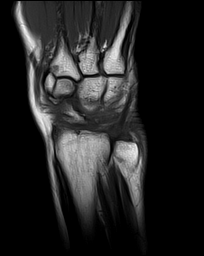
[im 10/15]
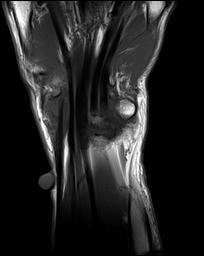
[im 15/15]
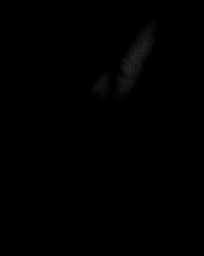

[Series 11: T2 fat-sat · coronal · 3.0mm · 0.23mm/px · 4 of 15 slices shown (2 of 2)]
[im 1/15]
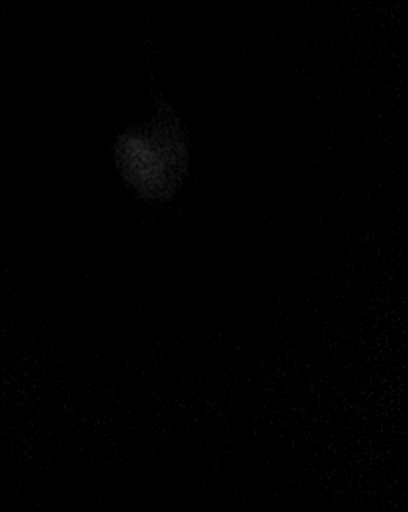
[im 5/15]
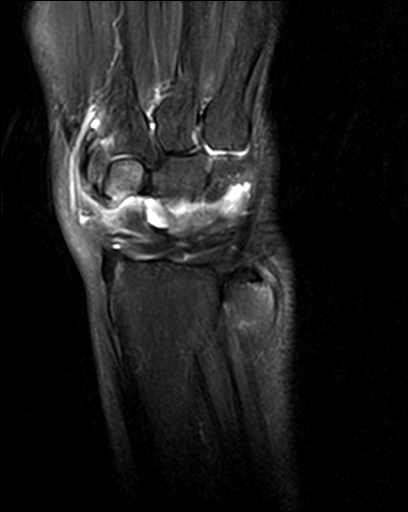
[im 10/15]
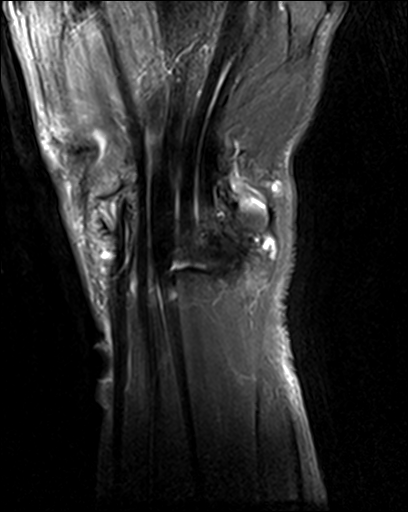
[im 15/15]
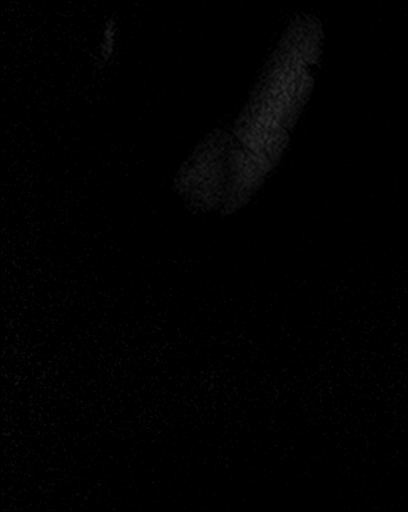

[Series 12: PD fat-sat · sagittal · 3.0mm · 0.23mm/px · 4 of 18 slices shown (2 of 2)]
[im 1/18]
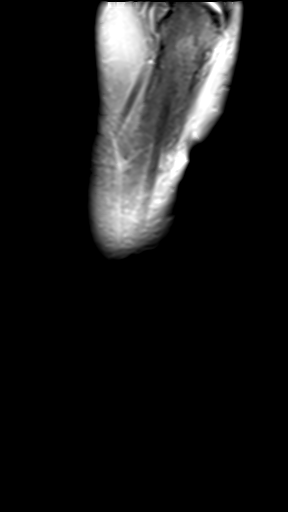
[im 6/18]
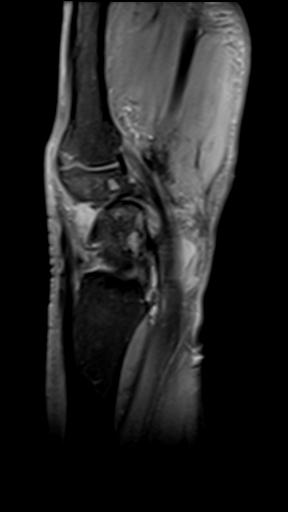
[im 12/18]
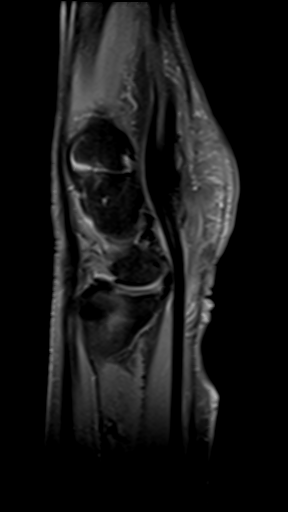
[im 18/18]
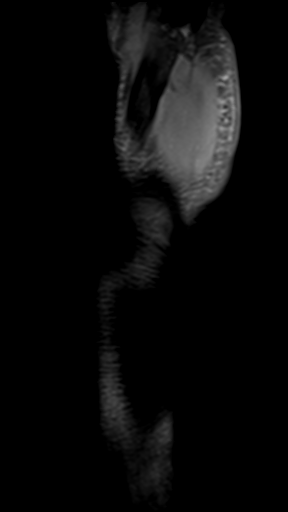

[26 of 40 positions shown; findings below may reference images not displayed]

FINDINGS: Ligaments: Intact scapholunate and lunotriquetral ligaments.

Triangular fibrocartilage: Intact TFCC.

Tendons: Flexor carpi radialis tendinosis and small partial tear
with mild tenosynovitis. Remaining flexor and extensor compartment
tendons are unremarkable.

Carpal tunnel/median nerve: Normal carpal tunnel. Normal median
nerve.

Guyon's canal: Normal.

Joint/cartilage: Advanced full-thickness cartilage loss of the
scaphotrapeziotrapezoid joint with prominent degenerative marrow
edema in the distal scaphoid, trapezium, and trapezoid. Small
midcarpal joint effusion.

Bones/carpal alignment: No acute fracture or dislocation. Normal
alignment. No suspicious bone lesion.

Other: No soft tissue mass or fluid collection.
IMPRESSION: 1. Flexor carpi radialis tendinosis and small partial tear with mild
tenosynovitis, accounting for the palpable abnormality. No soft
tissue mass.
2. Severe scaphotrapeziotrapezoid osteoarthritis.

## 2021-05-03 DIAGNOSIS — L089 Local infection of the skin and subcutaneous tissue, unspecified: Secondary | ICD-10-CM | POA: Diagnosis not present

## 2021-05-03 DIAGNOSIS — T22112A Burn of first degree of left forearm, initial encounter: Secondary | ICD-10-CM | POA: Diagnosis not present

## 2021-06-04 DIAGNOSIS — Z124 Encounter for screening for malignant neoplasm of cervix: Secondary | ICD-10-CM | POA: Diagnosis not present

## 2021-06-04 DIAGNOSIS — Z01419 Encounter for gynecological examination (general) (routine) without abnormal findings: Secondary | ICD-10-CM | POA: Diagnosis not present

## 2021-06-04 DIAGNOSIS — Z01411 Encounter for gynecological examination (general) (routine) with abnormal findings: Secondary | ICD-10-CM | POA: Diagnosis not present

## 2021-06-04 DIAGNOSIS — R1900 Intra-abdominal and pelvic swelling, mass and lump, unspecified site: Secondary | ICD-10-CM | POA: Diagnosis not present

## 2021-06-04 DIAGNOSIS — Z6821 Body mass index (BMI) 21.0-21.9, adult: Secondary | ICD-10-CM | POA: Diagnosis not present

## 2021-06-21 DIAGNOSIS — R1900 Intra-abdominal and pelvic swelling, mass and lump, unspecified site: Secondary | ICD-10-CM | POA: Diagnosis not present

## 2021-08-06 DIAGNOSIS — H02055 Trichiasis without entropian left lower eyelid: Secondary | ICD-10-CM | POA: Diagnosis not present

## 2021-08-06 DIAGNOSIS — H43813 Vitreous degeneration, bilateral: Secondary | ICD-10-CM | POA: Diagnosis not present

## 2021-08-06 DIAGNOSIS — H2513 Age-related nuclear cataract, bilateral: Secondary | ICD-10-CM | POA: Diagnosis not present

## 2021-08-06 DIAGNOSIS — H25041 Posterior subcapsular polar age-related cataract, right eye: Secondary | ICD-10-CM | POA: Diagnosis not present

## 2021-08-06 DIAGNOSIS — H25013 Cortical age-related cataract, bilateral: Secondary | ICD-10-CM | POA: Diagnosis not present

## 2021-09-14 DIAGNOSIS — G5601 Carpal tunnel syndrome, right upper limb: Secondary | ICD-10-CM | POA: Diagnosis not present

## 2021-09-14 DIAGNOSIS — Z0000 Encounter for general adult medical examination without abnormal findings: Secondary | ICD-10-CM | POA: Diagnosis not present

## 2021-09-14 DIAGNOSIS — N952 Postmenopausal atrophic vaginitis: Secondary | ICD-10-CM | POA: Diagnosis not present

## 2021-09-14 DIAGNOSIS — Z1389 Encounter for screening for other disorder: Secondary | ICD-10-CM | POA: Diagnosis not present

## 2021-09-14 DIAGNOSIS — H43393 Other vitreous opacities, bilateral: Secondary | ICD-10-CM | POA: Diagnosis not present

## 2021-09-14 DIAGNOSIS — H9313 Tinnitus, bilateral: Secondary | ICD-10-CM | POA: Diagnosis not present

## 2021-09-14 DIAGNOSIS — R609 Edema, unspecified: Secondary | ICD-10-CM | POA: Diagnosis not present

## 2021-09-14 DIAGNOSIS — M47816 Spondylosis without myelopathy or radiculopathy, lumbar region: Secondary | ICD-10-CM | POA: Diagnosis not present

## 2021-09-14 DIAGNOSIS — J301 Allergic rhinitis due to pollen: Secondary | ICD-10-CM | POA: Diagnosis not present

## 2021-12-26 ENCOUNTER — Other Ambulatory Visit: Payer: Self-pay | Admitting: Obstetrics

## 2021-12-26 DIAGNOSIS — Z1231 Encounter for screening mammogram for malignant neoplasm of breast: Secondary | ICD-10-CM

## 2022-01-02 ENCOUNTER — Other Ambulatory Visit: Payer: Self-pay | Admitting: Obstetrics

## 2022-01-02 DIAGNOSIS — M8580 Other specified disorders of bone density and structure, unspecified site: Secondary | ICD-10-CM

## 2022-06-11 DIAGNOSIS — N952 Postmenopausal atrophic vaginitis: Secondary | ICD-10-CM | POA: Diagnosis not present

## 2022-06-11 DIAGNOSIS — Z124 Encounter for screening for malignant neoplasm of cervix: Secondary | ICD-10-CM | POA: Diagnosis not present

## 2022-06-11 DIAGNOSIS — Z0000 Encounter for general adult medical examination without abnormal findings: Secondary | ICD-10-CM | POA: Diagnosis not present

## 2022-06-11 DIAGNOSIS — Z01419 Encounter for gynecological examination (general) (routine) without abnormal findings: Secondary | ICD-10-CM | POA: Diagnosis not present

## 2022-06-11 DIAGNOSIS — Z01411 Encounter for gynecological examination (general) (routine) with abnormal findings: Secondary | ICD-10-CM | POA: Diagnosis not present

## 2022-06-11 DIAGNOSIS — Z6820 Body mass index (BMI) 20.0-20.9, adult: Secondary | ICD-10-CM | POA: Diagnosis not present

## 2022-06-24 ENCOUNTER — Ambulatory Visit
Admission: RE | Admit: 2022-06-24 | Discharge: 2022-06-24 | Disposition: A | Discharge status: Home / Self Care | Payer: Medicare HMO | Source: Ambulatory Visit | Attending: Obstetrics | Admitting: Obstetrics

## 2022-06-24 DIAGNOSIS — Z1231 Encounter for screening mammogram for malignant neoplasm of breast: Secondary | ICD-10-CM | POA: Diagnosis not present

## 2022-06-24 DIAGNOSIS — M810 Age-related osteoporosis without current pathological fracture: Secondary | ICD-10-CM | POA: Diagnosis not present

## 2022-06-24 DIAGNOSIS — Z780 Asymptomatic menopausal state: Secondary | ICD-10-CM | POA: Diagnosis not present

## 2022-06-24 DIAGNOSIS — M85851 Other specified disorders of bone density and structure, right thigh: Secondary | ICD-10-CM | POA: Diagnosis not present

## 2022-06-24 DIAGNOSIS — M8580 Other specified disorders of bone density and structure, unspecified site: Secondary | ICD-10-CM

## 2022-07-19 DIAGNOSIS — M810 Age-related osteoporosis without current pathological fracture: Secondary | ICD-10-CM | POA: Diagnosis not present

## 2022-08-21 ENCOUNTER — Other Ambulatory Visit: Payer: Self-pay | Admitting: Obstetrics

## 2022-08-21 DIAGNOSIS — M8580 Other specified disorders of bone density and structure, unspecified site: Secondary | ICD-10-CM

## 2022-09-25 ENCOUNTER — Ambulatory Visit
Admission: RE | Admit: 2022-09-25 | Discharge: 2022-09-25 | Disposition: A | Discharge status: Home / Self Care | Payer: Medicare HMO | Source: Ambulatory Visit | Attending: Obstetrics | Admitting: Obstetrics

## 2022-09-25 DIAGNOSIS — M8580 Other specified disorders of bone density and structure, unspecified site: Secondary | ICD-10-CM

## 2022-10-14 DIAGNOSIS — H43393 Other vitreous opacities, bilateral: Secondary | ICD-10-CM | POA: Diagnosis not present

## 2022-10-14 DIAGNOSIS — Z0000 Encounter for general adult medical examination without abnormal findings: Secondary | ICD-10-CM | POA: Diagnosis not present

## 2022-10-14 DIAGNOSIS — J321 Chronic frontal sinusitis: Secondary | ICD-10-CM | POA: Diagnosis not present

## 2022-10-14 DIAGNOSIS — Z1331 Encounter for screening for depression: Secondary | ICD-10-CM | POA: Diagnosis not present

## 2022-10-14 DIAGNOSIS — Z23 Encounter for immunization: Secondary | ICD-10-CM | POA: Diagnosis not present

## 2022-10-14 DIAGNOSIS — H9313 Tinnitus, bilateral: Secondary | ICD-10-CM | POA: Diagnosis not present

## 2022-10-14 DIAGNOSIS — Z79899 Other long term (current) drug therapy: Secondary | ICD-10-CM | POA: Diagnosis not present

## 2022-10-14 DIAGNOSIS — M810 Age-related osteoporosis without current pathological fracture: Secondary | ICD-10-CM | POA: Diagnosis not present

## 2022-10-14 DIAGNOSIS — N952 Postmenopausal atrophic vaginitis: Secondary | ICD-10-CM | POA: Diagnosis not present

## 2022-10-14 DIAGNOSIS — M47816 Spondylosis without myelopathy or radiculopathy, lumbar region: Secondary | ICD-10-CM | POA: Diagnosis not present

## 2022-10-14 DIAGNOSIS — J301 Allergic rhinitis due to pollen: Secondary | ICD-10-CM | POA: Diagnosis not present

## 2022-10-14 DIAGNOSIS — E559 Vitamin D deficiency, unspecified: Secondary | ICD-10-CM | POA: Diagnosis not present

## 2022-10-14 DIAGNOSIS — G5601 Carpal tunnel syndrome, right upper limb: Secondary | ICD-10-CM | POA: Diagnosis not present

## 2022-10-30 DIAGNOSIS — J209 Acute bronchitis, unspecified: Secondary | ICD-10-CM | POA: Diagnosis not present

## 2022-10-30 DIAGNOSIS — R0981 Nasal congestion: Secondary | ICD-10-CM | POA: Diagnosis not present

## 2022-10-30 DIAGNOSIS — R070 Pain in throat: Secondary | ICD-10-CM | POA: Diagnosis not present

## 2022-10-30 DIAGNOSIS — R509 Fever, unspecified: Secondary | ICD-10-CM | POA: Diagnosis not present

## 2022-10-30 DIAGNOSIS — R051 Acute cough: Secondary | ICD-10-CM | POA: Diagnosis not present

## 2022-12-19 ENCOUNTER — Other Ambulatory Visit: Payer: Medicare HMO

## 2023-01-23 ENCOUNTER — Other Ambulatory Visit: Payer: Medicare HMO

## 2023-01-30 DIAGNOSIS — Z0100 Encounter for examination of eyes and vision without abnormal findings: Secondary | ICD-10-CM | POA: Diagnosis not present

## 2023-01-30 DIAGNOSIS — H16223 Keratoconjunctivitis sicca, not specified as Sjogren's, bilateral: Secondary | ICD-10-CM | POA: Diagnosis not present

## 2023-01-30 DIAGNOSIS — H43813 Vitreous degeneration, bilateral: Secondary | ICD-10-CM | POA: Diagnosis not present

## 2023-01-30 DIAGNOSIS — H0288B Meibomian gland dysfunction left eye, upper and lower eyelids: Secondary | ICD-10-CM | POA: Diagnosis not present

## 2023-01-30 DIAGNOSIS — H0288A Meibomian gland dysfunction right eye, upper and lower eyelids: Secondary | ICD-10-CM | POA: Diagnosis not present

## 2023-03-04 DIAGNOSIS — L821 Other seborrheic keratosis: Secondary | ICD-10-CM | POA: Diagnosis not present

## 2023-03-04 DIAGNOSIS — L570 Actinic keratosis: Secondary | ICD-10-CM | POA: Diagnosis not present

## 2023-05-14 ENCOUNTER — Other Ambulatory Visit: Payer: Self-pay | Admitting: Obstetrics

## 2023-05-14 DIAGNOSIS — Z1231 Encounter for screening mammogram for malignant neoplasm of breast: Secondary | ICD-10-CM

## 2023-06-18 ENCOUNTER — Emergency Department (HOSPITAL_BASED_OUTPATIENT_CLINIC_OR_DEPARTMENT_OTHER)
Admission: EM | Admit: 2023-06-18 | Discharge: 2023-06-19 | Disposition: A | Discharge status: Home / Self Care | Payer: Medicare HMO | Attending: Emergency Medicine | Admitting: Emergency Medicine

## 2023-06-18 ENCOUNTER — Encounter (HOSPITAL_BASED_OUTPATIENT_CLINIC_OR_DEPARTMENT_OTHER): Payer: Self-pay | Admitting: Urology

## 2023-06-18 ENCOUNTER — Other Ambulatory Visit: Payer: Self-pay

## 2023-06-18 ENCOUNTER — Emergency Department (HOSPITAL_BASED_OUTPATIENT_CLINIC_OR_DEPARTMENT_OTHER): Payer: Medicare HMO

## 2023-06-18 DIAGNOSIS — I517 Cardiomegaly: Secondary | ICD-10-CM | POA: Insufficient documentation

## 2023-06-18 DIAGNOSIS — J0110 Acute frontal sinusitis, unspecified: Secondary | ICD-10-CM | POA: Diagnosis not present

## 2023-06-18 DIAGNOSIS — R030 Elevated blood-pressure reading, without diagnosis of hypertension: Secondary | ICD-10-CM | POA: Insufficient documentation

## 2023-06-18 DIAGNOSIS — I10 Essential (primary) hypertension: Secondary | ICD-10-CM | POA: Diagnosis not present

## 2023-06-18 DIAGNOSIS — R232 Flushing: Secondary | ICD-10-CM | POA: Diagnosis not present

## 2023-06-18 DIAGNOSIS — R0789 Other chest pain: Secondary | ICD-10-CM | POA: Insufficient documentation

## 2023-06-18 LAB — BASIC METABOLIC PANEL
Anion gap: 11 (ref 5–15)
BUN: 8 mg/dL (ref 8–23)
CO2: 26 mmol/L (ref 22–32)
Calcium: 9.2 mg/dL (ref 8.9–10.3)
Chloride: 95 mmol/L — ABNORMAL LOW (ref 98–111)
Creatinine, Ser: 0.56 mg/dL (ref 0.44–1.00)
GFR, Estimated: >60 mL/min (ref >60)
Glucose, Bld: 96 mg/dL (ref 70–99)
Potassium: 3.7 mmol/L (ref 3.5–5.1)
Sodium: 132 mmol/L — ABNORMAL LOW (ref 135–145)

## 2023-06-18 LAB — TROPONIN I (HIGH SENSITIVITY)
Troponin I (High Sensitivity): 4 ng/L (ref <18)
Troponin I (High Sensitivity): 5 ng/L (ref <18)

## 2023-06-18 LAB — CBC
HCT: 38.6 % (ref 36.0–46.0)
Hemoglobin: 12.9 g/dL (ref 12.0–15.0)
MCH: 31 pg (ref 26.0–34.0)
MCHC: 33.4 g/dL (ref 30.0–36.0)
MCV: 92.8 fL (ref 80.0–100.0)
Platelets: 286 10*3/uL (ref 150–400)
RBC: 4.16 MIL/uL (ref 3.87–5.11)
RDW: 12.6 % (ref 11.5–15.5)
WBC: 6.7 10*3/uL (ref 4.0–10.5)
nRBC: 0 % (ref 0.0–0.2)

## 2023-06-18 MED ORDER — OXYMETAZOLINE HCL 0.05 % NA SOLN: 1.0000 | Freq: Two times a day (BID) | NASAL | 0 refills | Status: AC

## 2023-06-18 MED ORDER — AMOXICILLIN-POT CLAVULANATE 875-125 MG PO TABS: 1.0000 | ORAL_TABLET | Freq: Two times a day (BID) | ORAL | 0 refills | Status: AC

## 2023-06-18 NOTE — ED Provider Notes (Signed)
[facility] Provider Note  CSN: [id number] Arrival date & time: 06/18/23 1904  Chief Complaint(s) Chest Pain  HPI Ruth Rios is a 72 y.o. female with past medical history as below, significant for atypical mole, peripheral edema who presents to the ED with complaint of flushing, elevated blood pressure.  Patient reports just prior to arrival she was arguing with someone on the telephone, felt her face become very flushed, had a tightness sensation in her chest and her blood pressure was found to be elevated.  The flushing sensation has resolved but she is still having some slight tightness to her chest.  Blood pressure was elevated 180s.  She also reports sinus congestion and drainage over the past 7 days, green mucus, no fevers, no dyspnea.  Intermittent coughing.    Past Medical History Past Medical History:  Diagnosis Date   Atypical mole 04/19/2005   moderate atypica- mid upper back, left mid back, mid chest, left upper abdomen   Atypical nevus 08/27/2005   slight - moderate- Right lower abdomen   There are no problems to display for this patient.  Home Medication(s) Prior to Admission medications   Medication Sig Start Date End Date Taking? Authorizing Provider  amoxicillin-clavulanate (AUGMENTIN) 875-125 MG tablet Take 1 tablet by mouth every 12 (twelve) hours. 06/18/23  Yes Tanda Rockers A, DO  oxymetazoline (AFRIN NASAL SPRAY) 0.05 % nasal spray Place 1 spray into both nostrils 2 (two) times daily for 3 days. 06/18/23 06/21/23 Yes Sloan Leiter, DO  triamterene-hydrochlorothiazide (MAXZIDE) 75-50 MG tablet Take 1 tablet by mouth daily. 10/21/19   [provider]                                                                                                                                    Past Surgical History History reviewed. No pertinent surgical history. Family History Family History  Problem Relation Age of  Onset   Breast cancer Neg Hx     Social History Social History   Tobacco Use   Smoking status: Never   Smokeless tobacco: Never  Vaping Use   Vaping status: Never Used  Substance Use Topics   Alcohol use: Not Currently   Drug use: Never   Allergies Patient has no known allergies.  Review of Systems Review of Systems  Constitutional:  Negative for chills and fever.  HENT:  Negative for congestion.   Respiratory:  Positive for chest tightness.   Cardiovascular:  Positive for chest pain. Negative for palpitations.  Gastrointestinal:  Negative for abdominal pain, nausea and vomiting.  Genitourinary:  Negative for difficulty urinating.  Musculoskeletal:  Negative for arthralgias.  All other systems reviewed and are negative.   Physical Exam Vital Signs  I have reviewed the triage vital signs BP (!) 149/79   Pulse 61   Temp 98.4 F (36.9 C) (Oral)   Resp (!) 9  Ht 5\' 4"  (1.626 m)   Wt 54.4 kg   SpO2 100%   BMI 20.60 kg/m  Physical Exam Vitals and nursing note reviewed.  Constitutional:      General: She is not in acute distress.    Appearance: Normal appearance. She is not ill-appearing.  HENT:     Head: Normocephalic and atraumatic.     Right Ear: External ear normal.     Left Ear: External ear normal.     Nose: Nose normal.     Mouth/Throat:     Mouth: Mucous membranes are moist.  Eyes:     General: No scleral icterus.       Right eye: No discharge.        Left eye: No discharge.  Neck:     Thyroid: No thyromegaly.  Cardiovascular:     Rate and Rhythm: Normal rate and regular rhythm.     Pulses: Normal pulses.     Heart sounds: Normal heart sounds.  Pulmonary:     Effort: Pulmonary effort is normal. No respiratory distress.     Breath sounds: Normal breath sounds. No stridor.  Abdominal:     General: Abdomen is flat. There is no distension.     Palpations: Abdomen is soft.     Tenderness: There is no abdominal tenderness.  Musculoskeletal:      Cervical back: No rigidity.     Right lower leg: No edema.     Left lower leg: No edema.  Skin:    General: Skin is warm and dry.     Capillary Refill: Capillary refill takes less than 2 seconds.  Neurological:     Mental Status: She is alert.  Psychiatric:        Mood and Affect: Mood normal.        Behavior: Behavior normal. Behavior is cooperative.     ED Results and Treatments Labs (all labs ordered are listed, but only abnormal results are displayed) Labs Reviewed  BASIC METABOLIC PANEL - Abnormal; Notable for the following components:      Result Value   Sodium 132 (*)    Chloride 95 (*)    All other components within normal limits  CBC  TSH  TROPONIN I (HIGH SENSITIVITY)  TROPONIN I (HIGH SENSITIVITY)                                                                                                                          Radiology DG Chest 2 View  Result Date: 06/18/2023 CLINICAL DATA:  Hypertension and flushed feeling, initial encounter EXAM: CHEST - 2 VIEW COMPARISON:  None Available. FINDINGS: The heart size and mediastinal contours are within normal limits. Both lungs are clear. The visualized skeletal structures are unremarkable. IMPRESSION: No active cardiopulmonary disease. Electronically Signed   By: Alcide Clever M.D.   On: 06/18/2023 20:20    Pertinent labs & imaging results that were available during my care of the patient were reviewed by  me and considered in my medical decision making (see MDM for details).  Medications Ordered in ED Medications - No data to display                                                                                                                                   Procedures Procedures  (including critical care time)  Medical Decision Making / ED Course    Medical Decision Making:    Ruth Rios is a 72 y.o. female with past medical history as below, significant for atypical mole, peripheral edema who presents to  the ED with complaint of flushing, elevated blood pressure.. The complaint involves an extensive differential diagnosis and also carries with it a high risk of complications and morbidity.  Serious etiology was considered. Ddx includes but is not limited to: Endocrine disturbance, elevated blood pressure, Differential includes all life-threatening causes for chest pain. This includes but is not exclusive to acute coronary syndrome, aortic dissection, pulmonary embolism, cardiac tamponade, community-acquired pneumonia, pericarditis, musculoskeletal chest wall pain, etc.   Complete initial physical exam performed, notably the patient was in no acute distress, symptoms have since resolved.    Reviewed and confirmed nursing documentation for past medical history, family history, social history.  Vital signs reviewed.      Clinical Course as of 06/18/23 2353  Wed Jun 18, 2023  2153 BP(!): 145/91 Improved w/o intervention [SG]  2344 Trop neg x2 [SG]    Clinical Course User Index [SG] Sloan Leiter, DO    Brief summary: 72 year old female here with flushing sensation and elevated blood pressure after verbal altercation with someone on the telephone.  Symptoms have since improved.  Blood pressure has since improved.  Flushing has resolved.  She is having some mild chest tightness during this episode which is also resolved.  Also concern for possible sinusitis, ongoing drainage with green mucus report systemic complaints.  Will cover patient for bacterial sinusitis given duration of symptoms and progressive worsening symptoms, Augmentin, decongestant, sinus rinse.  Follow-up PCP  Chest pain appears atypical.  The patient's chest pain is not suggestive of pulmonary embolus, cardiac ischemia, aortic dissection, pericarditis, myocarditis, pulmonary embolism, pneumothorax, pneumonia, Zoster, or esophageal perforation, or other serious etiology.  Historically not abrupt in onset, tearing or ripping,  pulses symmetric. EKG nonspecific for ischemia/infarction. No dysrhythmias, brugada, WPW, prolonged QT noted.   Troponin negative x2. CXR reviewed. Labs without demonstration of acute pathology unless otherwise noted above. Low HEART Score: 0-3 points (0.9-1.7% risk of MACE).  Given the extremely low risk of these diagnoses further testing and evaluation for these possibilities does not appear to be indicated at this time. Patient in no distress and overall condition improved here in the ED. Detailed discussions were had with the patient regarding current findings, and need for close f/u with PCP or on call doctor. The patient has been instructed to return immediately if the symptoms worsen in  any way for re-evaluation. Patient verbalized understanding and is in agreement with current care plan. All questions answered prior to discharge.                Additional history obtained: -Additional history obtained from spouse -External records from outside source obtained and reviewed including: Chart review including previous notes, labs, imaging, consultation notes including  home medications, labs/imaging   Lab Tests: -I ordered, reviewed, and interpreted labs.   The pertinent results include:   Labs Reviewed  BASIC METABOLIC PANEL - Abnormal; Notable for the following components:      Result Value   Sodium 132 (*)    Chloride 95 (*)    All other components within normal limits  CBC  TSH  TROPONIN I (HIGH SENSITIVITY)  TROPONIN I (HIGH SENSITIVITY)    Notable for stable labs  EKG   EKG Interpretation Date/Time:    Ventricular Rate:    PR Interval:    QRS Duration:    QT Interval:    QTC Calculation:   R Axis:      Text Interpretation:           Imaging Studies ordered: I ordered imaging studies including CXR I independently visualized the following imaging with scope of interpretation limited to determining acute life threatening conditions related to  emergency care; findings noted above I independently visualized and interpreted imaging. I agree with the radiologist interpretation   Medicines ordered and prescription drug management: Meds ordered this encounter  Medications   amoxicillin-clavulanate (AUGMENTIN) 875-125 MG tablet    Sig: Take 1 tablet by mouth every 12 (twelve) hours.    Dispense:  14 tablet    Refill:  0   oxymetazoline (AFRIN NASAL SPRAY) 0.05 % nasal spray    Sig: Place 1 spray into both nostrils 2 (two) times daily for 3 days.    Dispense:  15 mL    Refill:  0    -I have reviewed the patients home medicines and have made adjustments as needed   Consultations Obtained: na   Cardiac Monitoring: The patient was maintained on a cardiac monitor.  I personally viewed and interpreted the cardiac monitored which showed an underlying rhythm of: NSR Continuous pulse oximetry interpreted by myself, 100% on RA.    Social Determinants of Health:  Diagnosis or treatment significantly limited by social determinants of health: na   Reevaluation: After the interventions noted above, I reevaluated the patient and found that they have improved  Co morbidities that complicate the patient evaluation  Past Medical History:  Diagnosis Date   Atypical mole 04/19/2005   moderate atypica- mid upper back, left mid back, mid chest, left upper abdomen   Atypical nevus 08/27/2005   slight - moderate- Right lower abdomen      Dispostion: Disposition decision including need for hospitalization was considered, and patient discharged from emergency department.    Final Clinical Impression(s) / ED Diagnoses Final diagnoses:  Atypical chest pain  Acute frontal sinusitis, recurrence not specified        Sloan Leiter, DO 06/18/23 2354

## 2023-06-18 NOTE — ED Triage Notes (Signed)
Pt states was having an argument on the phone with [employer], got flushed and BP was high 180 systolically  States slight chest tightness

## 2023-06-18 NOTE — Discharge Instructions (Addendum)
Return to the Emergency Department if you have unusual chest pain, pressure, or discomfort, shortness of breath, nausea, vomiting, burping, heartburn, tingling upper body parts, sweating, cold, clammy skin, or racing heartbeat. Call 911 if you think you are having a heart attack. Take all medications as prescribed - notify your doctor if you have any side effects. Follow cardiac diet - avoid fatty & fried foods, don't eat too much red meat, eat lots of fruits & vegetables, and dairy products should be low fat. Please lose weight if you are overweight. Become more active with walking, gardening, or any other activity that gets you to moving.   Please return to the emergency department immediately for any new or concerning symptoms, or if you get worse.  Consider using a humidifier in your bedroom.  Your thyroid hormone level is pending, this will likely result in the next 24 hours, please follow up with your PCP in regards to this.

## 2023-06-19 LAB — TSH: TSH: 5.358 u[IU]/mL — ABNORMAL HIGH (ref 0.350–4.500)

## 2023-06-27 ENCOUNTER — Ambulatory Visit: Payer: Medicare HMO

## 2023-06-30 DIAGNOSIS — R946 Abnormal results of thyroid function studies: Secondary | ICD-10-CM | POA: Diagnosis not present

## 2023-06-30 DIAGNOSIS — R030 Elevated blood-pressure reading, without diagnosis of hypertension: Secondary | ICD-10-CM | POA: Diagnosis not present

## 2023-06-30 DIAGNOSIS — E871 Hypo-osmolality and hyponatremia: Secondary | ICD-10-CM | POA: Diagnosis not present

## 2023-06-30 DIAGNOSIS — R0789 Other chest pain: Secondary | ICD-10-CM | POA: Diagnosis not present

## 2023-07-17 ENCOUNTER — Ambulatory Visit
Admission: RE | Admit: 2023-07-17 | Discharge: 2023-07-17 | Disposition: A | Discharge status: Home / Self Care | Payer: Medicare HMO | Source: Ambulatory Visit | Attending: Obstetrics | Admitting: Obstetrics

## 2023-07-17 DIAGNOSIS — Z1231 Encounter for screening mammogram for malignant neoplasm of breast: Secondary | ICD-10-CM | POA: Diagnosis not present

## 2023-08-01 DIAGNOSIS — M810 Age-related osteoporosis without current pathological fracture: Secondary | ICD-10-CM | POA: Diagnosis not present

## 2023-08-01 DIAGNOSIS — I10 Essential (primary) hypertension: Secondary | ICD-10-CM | POA: Diagnosis not present

## 2023-09-04 DIAGNOSIS — D225 Melanocytic nevi of trunk: Secondary | ICD-10-CM | POA: Diagnosis not present

## 2023-09-04 DIAGNOSIS — D3610 Benign neoplasm of peripheral nerves and autonomic nervous system, unspecified: Secondary | ICD-10-CM | POA: Diagnosis not present

## 2023-09-04 DIAGNOSIS — L578 Other skin changes due to chronic exposure to nonionizing radiation: Secondary | ICD-10-CM | POA: Diagnosis not present

## 2023-09-04 DIAGNOSIS — L813 Cafe au lait spots: Secondary | ICD-10-CM | POA: Diagnosis not present

## 2023-09-04 DIAGNOSIS — I781 Nevus, non-neoplastic: Secondary | ICD-10-CM | POA: Diagnosis not present

## 2023-09-05 DIAGNOSIS — I10 Essential (primary) hypertension: Secondary | ICD-10-CM | POA: Diagnosis not present

## 2023-09-25 DIAGNOSIS — R7989 Other specified abnormal findings of blood chemistry: Secondary | ICD-10-CM | POA: Diagnosis not present

## 2023-11-14 ENCOUNTER — Other Ambulatory Visit: Payer: Self-pay | Admitting: Internal Medicine

## 2023-11-14 DIAGNOSIS — R202 Paresthesia of skin: Secondary | ICD-10-CM | POA: Diagnosis not present

## 2023-11-14 DIAGNOSIS — Z0000 Encounter for general adult medical examination without abnormal findings: Secondary | ICD-10-CM | POA: Diagnosis not present

## 2023-11-14 DIAGNOSIS — R5381 Other malaise: Secondary | ICD-10-CM

## 2023-11-14 DIAGNOSIS — Z136 Encounter for screening for cardiovascular disorders: Secondary | ICD-10-CM | POA: Diagnosis not present

## 2023-11-14 DIAGNOSIS — Z13820 Encounter for screening for osteoporosis: Secondary | ICD-10-CM

## 2023-11-14 DIAGNOSIS — Z1331 Encounter for screening for depression: Secondary | ICD-10-CM | POA: Diagnosis not present

## 2023-11-19 DIAGNOSIS — E538 Deficiency of other specified B group vitamins: Secondary | ICD-10-CM | POA: Diagnosis not present

## 2023-11-26 DIAGNOSIS — E538 Deficiency of other specified B group vitamins: Secondary | ICD-10-CM | POA: Diagnosis not present

## 2023-12-03 DIAGNOSIS — E538 Deficiency of other specified B group vitamins: Secondary | ICD-10-CM | POA: Diagnosis not present

## 2023-12-10 DIAGNOSIS — E538 Deficiency of other specified B group vitamins: Secondary | ICD-10-CM | POA: Diagnosis not present

## 2024-01-01 ENCOUNTER — Telehealth: Payer: Self-pay | Admitting: Pharmacist

## 2024-01-01 NOTE — Progress Notes (Signed)
   01/01/2024  Patient ID: Ruth Rios, female   DOB: Mar 30, 1951, 73 y.o.   MRN: [id number]  Patient appeared on insurance report for at-risk for failing 2025 metric: Medication Adherence for Hypertension [facility])    Medication: Lisinopril 10mg - Hydrochlorothiazide 12.5mg   Last fill date: 2/20 30DS  Switched to hydrochlorothiazide 12.5mg  alone, but did have 2 fills already.   Therefore, patient will fail adherence metric for 2025.   Aloysius Lewis, PharmD [facility]  Phone Number: [phone]

## 2024-01-13 DIAGNOSIS — E538 Deficiency of other specified B group vitamins: Secondary | ICD-10-CM | POA: Diagnosis not present

## 2024-01-15 DIAGNOSIS — H524 Presbyopia: Secondary | ICD-10-CM | POA: Diagnosis not present

## 2024-01-15 DIAGNOSIS — H25013 Cortical age-related cataract, bilateral: Secondary | ICD-10-CM | POA: Diagnosis not present

## 2024-01-15 DIAGNOSIS — H43813 Vitreous degeneration, bilateral: Secondary | ICD-10-CM | POA: Diagnosis not present

## 2024-01-15 DIAGNOSIS — H16223 Keratoconjunctivitis sicca, not specified as Sjogren's, bilateral: Secondary | ICD-10-CM | POA: Diagnosis not present

## 2024-01-15 DIAGNOSIS — H353121 Nonexudative age-related macular degeneration, left eye, early dry stage: Secondary | ICD-10-CM | POA: Diagnosis not present

## 2024-01-15 DIAGNOSIS — H5203 Hypermetropia, bilateral: Secondary | ICD-10-CM | POA: Diagnosis not present

## 2024-02-10 DIAGNOSIS — E538 Deficiency of other specified B group vitamins: Secondary | ICD-10-CM | POA: Diagnosis not present

## 2024-02-18 DIAGNOSIS — M5030 Other cervical disc degeneration, unspecified cervical region: Secondary | ICD-10-CM | POA: Diagnosis not present

## 2024-02-18 DIAGNOSIS — M75121 Complete rotator cuff tear or rupture of right shoulder, not specified as traumatic: Secondary | ICD-10-CM | POA: Diagnosis not present

## 2024-03-16 DIAGNOSIS — E538 Deficiency of other specified B group vitamins: Secondary | ICD-10-CM | POA: Diagnosis not present

## 2024-04-19 DIAGNOSIS — E538 Deficiency of other specified B group vitamins: Secondary | ICD-10-CM | POA: Diagnosis not present

## 2024-05-11 DIAGNOSIS — E538 Deficiency of other specified B group vitamins: Secondary | ICD-10-CM | POA: Diagnosis not present

## 2024-05-24 DIAGNOSIS — E538 Deficiency of other specified B group vitamins: Secondary | ICD-10-CM | POA: Diagnosis not present

## 2024-06-22 ENCOUNTER — Other Ambulatory Visit: Payer: Self-pay | Admitting: Internal Medicine

## 2024-06-22 DIAGNOSIS — Z1231 Encounter for screening mammogram for malignant neoplasm of breast: Secondary | ICD-10-CM

## 2024-06-28 ENCOUNTER — Other Ambulatory Visit

## 2024-07-19 ENCOUNTER — Ambulatory Visit

## 2024-08-05 ENCOUNTER — Other Ambulatory Visit

## 2024-09-02 ENCOUNTER — Ambulatory Visit
